# Patient Record
Sex: Male | Born: 2000 | Hispanic: No | Marital: Single | Smoking: Never smoker
Health system: Southern US, Community
[De-identification: ages and names within clinical notes are randomized; demographics above are authoritative.]

## PROBLEM LIST (undated history)

## (undated) DIAGNOSIS — E669 Obesity, unspecified: Secondary | ICD-10-CM

## (undated) HISTORY — PX: LAPAROSCOPIC ORCHIECTOMY: SUR785

---
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
## 2021-01-22 IMAGING — MR MR SHOULDER*R* W/CM
6 series · 40 of 40 positions shown · IV contrast (agent unspecified)
Comparison: None.

CLINICAL DATA: Right shoulder pain and weakness for 2 years

EXAM:
MR ARTHROGRAM OF THE RIGHT SHOULDER
TECHNIQUE: Multiplanar, multisequence MR imaging of the right shoulder was
performed following the administration of intra-articular contrast.
CONTRAST:  See Injection Documentation.

[Series 3: T1 fat-sat · axial · 4.0mm · 0.27mm/px · z∈[-10,+73]mm · 8 of 18 slices shown (1 of 4)]
[im 1/18]
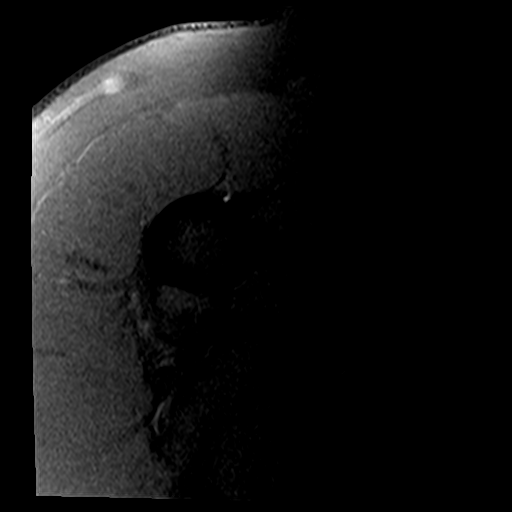
[im 3/18]
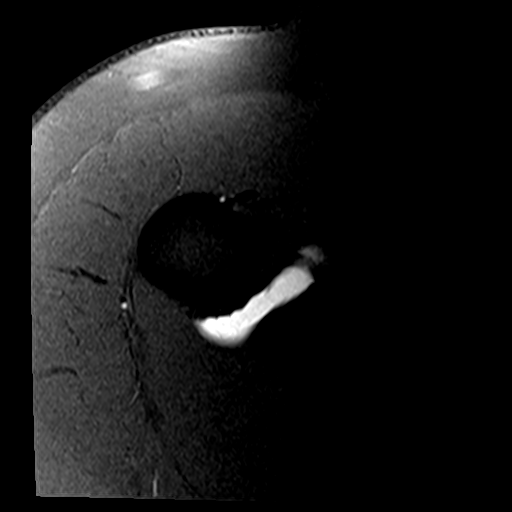
[im 5/18]
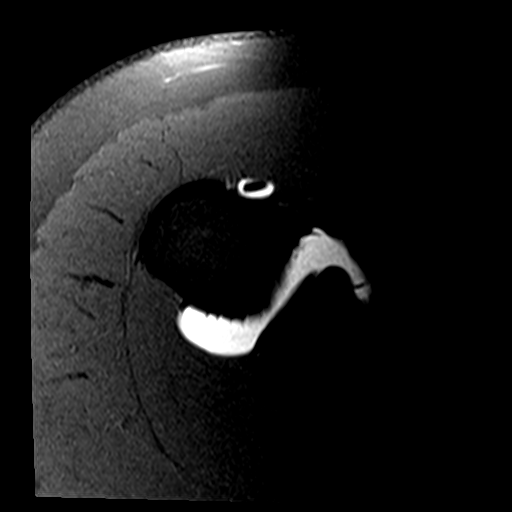
[im 8/18]
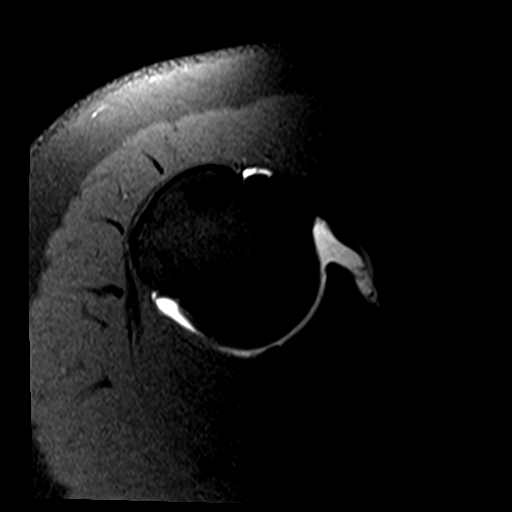
[im 10/18]
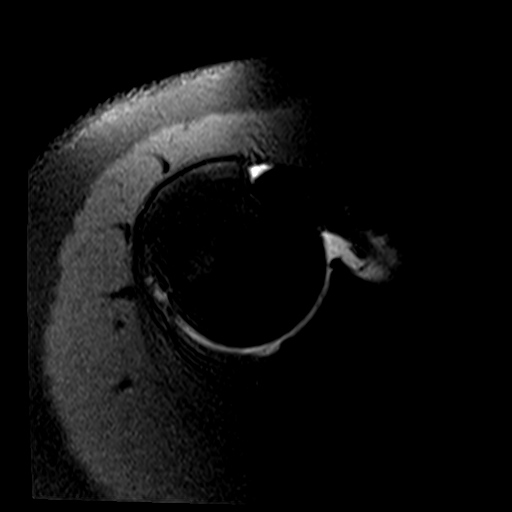
[im 13/18]
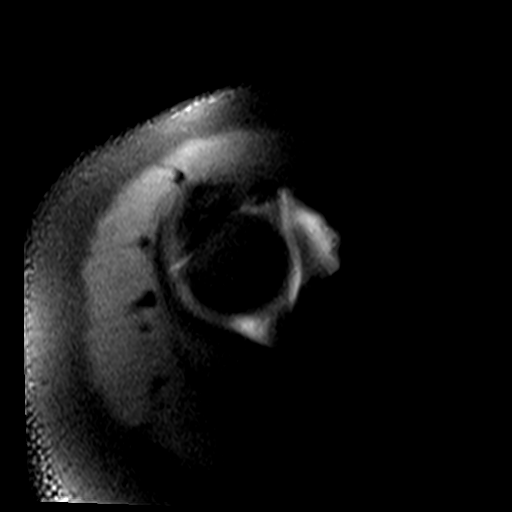
[im 15/18]
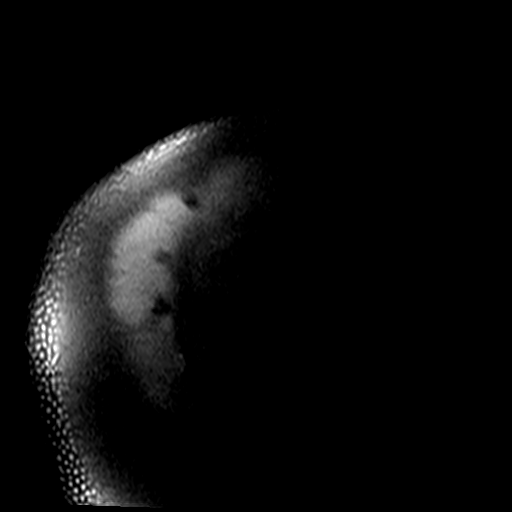
[im 18/18]
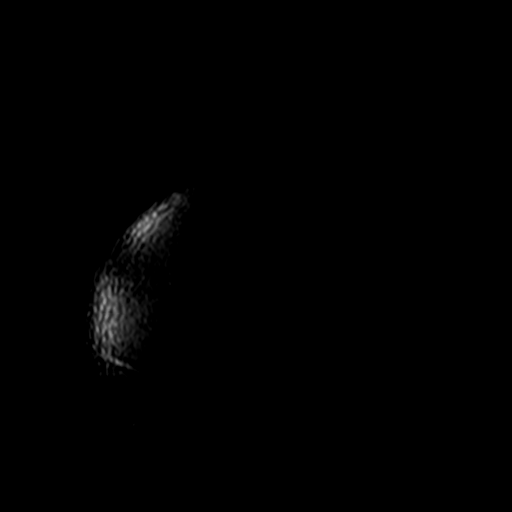

[Series 4: T2 fat-sat · oblique · 4.0mm · 0.55mm/px · 8 of 18 slices shown (1 of 2)]
[im 1/18]
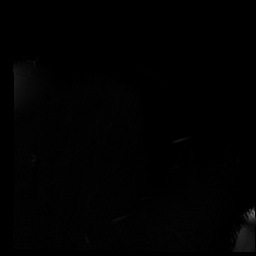
[im 3/18]
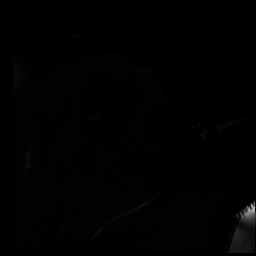
[im 5/18]
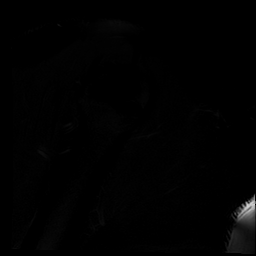
[im 8/18]
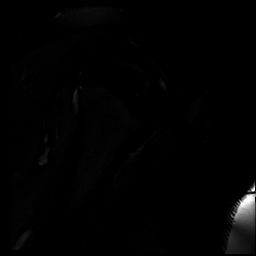
[im 10/18]
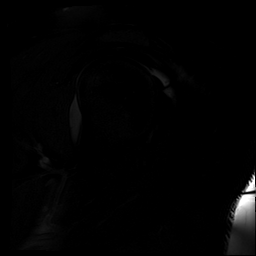
[im 13/18]
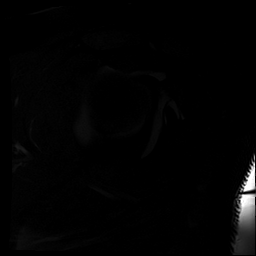
[im 15/18]
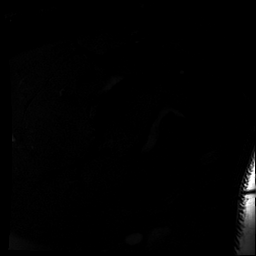
[im 18/18]
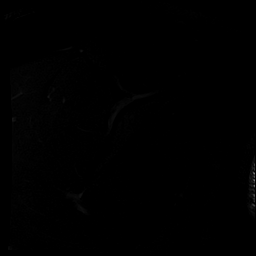

[Series 5: T1 fat-sat · oblique · 4.0mm · 0.55mm/px · 6 of 16 slices shown (2 of 4)]
[im 1/16]
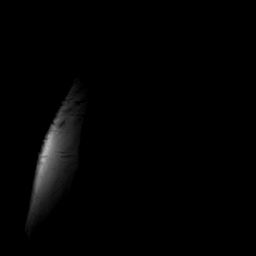
[im 4/16]
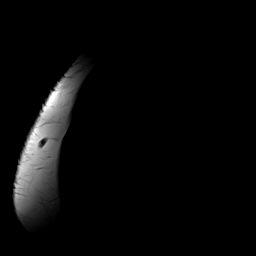
[im 7/16]
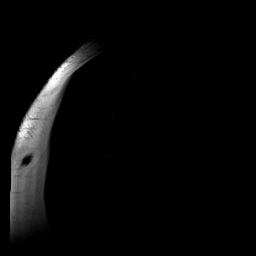
[im 10/16]
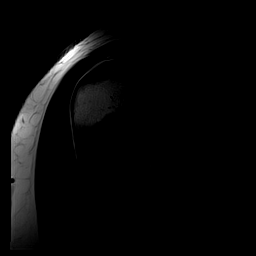
[im 13/16]
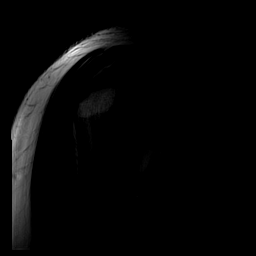
[im 16/16]
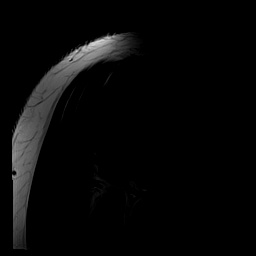

[Series 6: T1 fat-sat · oblique · 4.0mm · 0.55mm/px · 6 of 16 slices shown (3 of 4)]
[im 1/16]
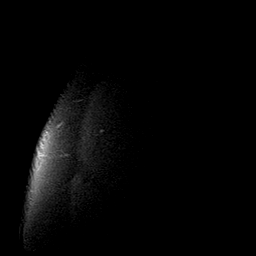
[im 4/16]
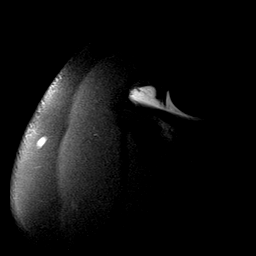
[im 7/16]
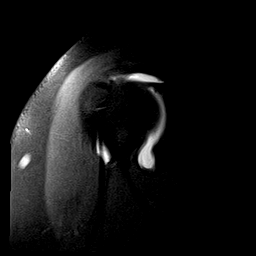
[im 10/16]
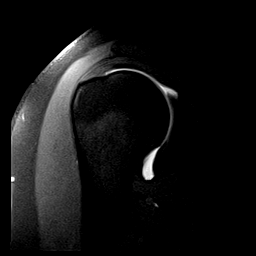
[im 13/16]
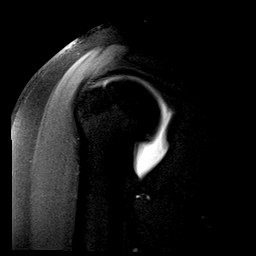
[im 16/16]
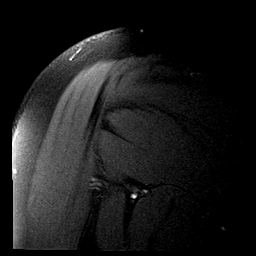

[Series 7: T2 fat-sat · oblique · 4.0mm · 0.55mm/px · 6 of 16 slices shown (2 of 2)]
[im 1/16]
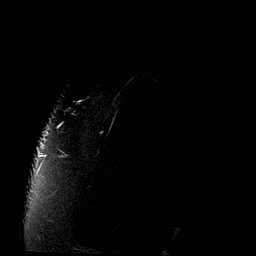
[im 4/16]
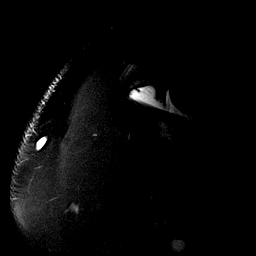
[im 7/16]
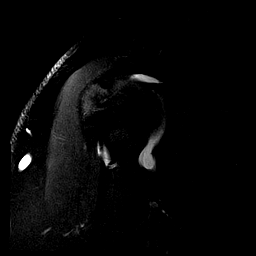
[im 10/16]
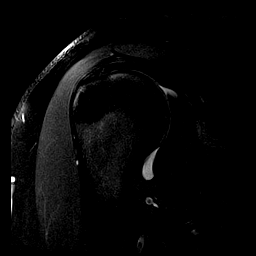
[im 13/16]
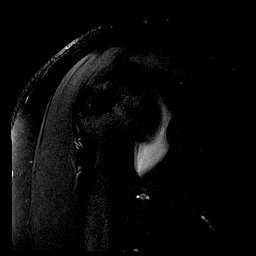
[im 16/16]
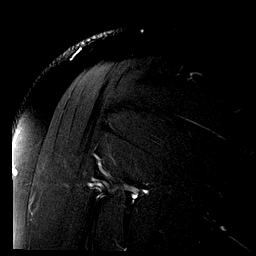

[Series 14: T1 fat-sat · sagittal · 4.0mm · 0.59mm/px · 6 of 16 slices shown (4 of 4)]
[im 1/16]
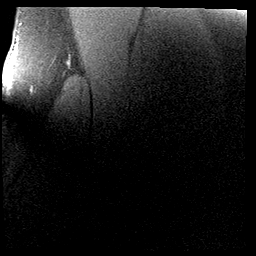
[im 4/16]
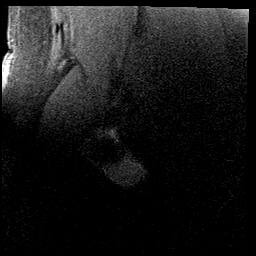
[im 7/16]
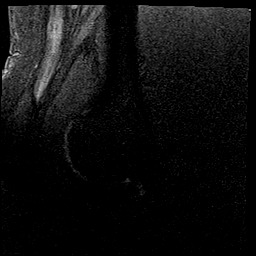
[im 10/16]
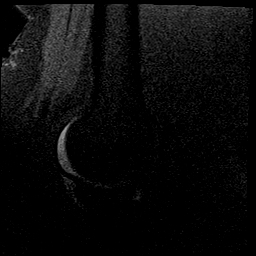
[im 13/16]
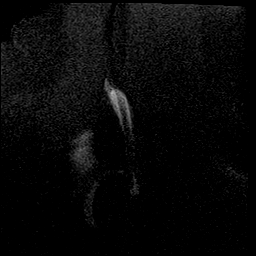
[im 16/16]
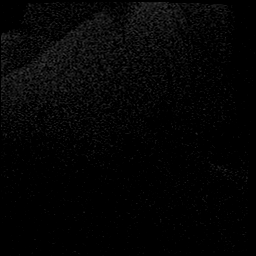

[40 of 40 positions shown; findings below may reference images not displayed]

FINDINGS: Rotator cuff: The supraspinatus, infraspinatus, subscapularis, and
teres minor tendons are intact without tendinosis or tear.

Muscles: Normal rotator cuff muscle bulk without edema, atrophy, or
fatty infiltration.

Biceps long head: Intact and appropriately located without
tendinosis or tear.

Acromioclavicular Joint: Normal AC joint. No fluid or contrast
within the subacromial-subdeltoid bursa.

Glenohumeral Joint: Well distended with injected contrast. Cartilage
is intact without chondral thinning or focal defect.

Labrum: Irregular tear of the posterosuperior labrum at
approximately the 10 o'clock-66 o'clock positions (series 3, images
8-9; series 6, images 5-6). The remainder of the labrum appears
otherwise intact. No paralabral cyst.

Bones: No acute fracture. No dislocation. No marrow edema or
suspicious bone lesion.
IMPRESSION: 1. Posterosuperior labral tear.
2. Intact rotator cuff.

## 2024-03-07 ENCOUNTER — Encounter (HOSPITAL_COMMUNITY): Payer: Self-pay

## 2024-03-07 ENCOUNTER — Other Ambulatory Visit: Payer: Self-pay

## 2024-03-07 ENCOUNTER — Emergency Department (HOSPITAL_COMMUNITY)

## 2024-03-07 ENCOUNTER — Emergency Department (HOSPITAL_COMMUNITY)
Admission: EM | Admit: 2024-03-07 | Discharge: 2024-03-07 | Disposition: A | Discharge status: Home / Self Care | Attending: Emergency Medicine | Admitting: Emergency Medicine

## 2024-03-07 DIAGNOSIS — S61252A Open bite of right middle finger without damage to nail, initial encounter: Secondary | ICD-10-CM | POA: Diagnosis not present

## 2024-03-07 DIAGNOSIS — Z23 Encounter for immunization: Secondary | ICD-10-CM | POA: Insufficient documentation

## 2024-03-07 DIAGNOSIS — W540XXA Bitten by dog, initial encounter: Secondary | ICD-10-CM | POA: Insufficient documentation

## 2024-03-07 DIAGNOSIS — S61256A Open bite of right little finger without damage to nail, initial encounter: Secondary | ICD-10-CM | POA: Insufficient documentation

## 2024-03-07 MED ORDER — AMOXICILLIN-POT CLAVULANATE 875-125 MG PO TABS: 1.0000 | ORAL_TABLET | Freq: Two times a day (BID) | ORAL | 0 refills | Status: DC

## 2024-03-07 MED ORDER — AMOXICILLIN-POT CLAVULANATE 875-125 MG PO TABS
1.0000 | ORAL_TABLET | Freq: Once | ORAL | Status: AC
  Administered 2024-03-07: 1 via ORAL
  Filled 2024-03-07: qty 1

## 2024-03-07 MED ORDER — KETOROLAC TROMETHAMINE 15 MG/ML IJ SOLN
15.0000 mg | Freq: Once | INTRAMUSCULAR | Status: AC
  Administered 2024-03-07: 15 mg via INTRAMUSCULAR
  Filled 2024-03-07: qty 1

## 2024-03-07 MED ORDER — TETANUS-DIPHTH-ACELL PERTUSSIS 5-2.5-18.5 LF-MCG/0.5 IM SUSY
0.5000 mL | PREFILLED_SYRINGE | Freq: Once | INTRAMUSCULAR | Status: AC
  Administered 2024-03-07: 0.5 mL via INTRAMUSCULAR
  Filled 2024-03-07: qty 0.5

## 2024-03-07 NOTE — ED Provider Notes (Signed)
 [place] EMERGENCY DEPARTMENT AT [facility] Provider Note   CSN: [id number] Arrival date & time: 03/07/24  1536     History  Chief Complaint  Patient presents with   Animal Bite    Cameron Mckinney is a 23 y.o. male.  23 yo M with a chief complaint of a dog bite.  Patient had 2 dogs that were fighting over food and he tried to break it up and they bit him on the hand.  He has a bite to his right middle finger as well as to the right fifth digit.  Complaining mostly of pain and swelling to the right middle finger.  Denies other injury.  Unsure of last tetanus.   Animal Bite      Home Medications Prior to Admission medications   Medication Sig Start Date End Date Taking? Authorizing Provider  amoxicillin-clavulanate (AUGMENTIN) 875-125 MG tablet Take 1 tablet by mouth every 12 (twelve) hours. 03/07/24  Yes [name], DO  cetirizine (ZYRTEC) 10 MG tablet Take 10 mg by mouth daily.    [provider]  melatonin 3 MG TABS tablet Take 3 mg by mouth at bedtime.    [provider]  Multiple Vitamins-Minerals (MULTIVITAMIN ADULTS PO) Take by mouth.    [provider]      Allergies    Patient has no known allergies.    Review of Systems   Review of Systems  Physical Exam Updated Vital Signs BP 139/73 (BP Location: Left Arm)   Pulse 73   Temp 97.6 F (36.4 C) (Oral)   Resp 18   Ht 6\' 1"  (1.854 m)   Wt 136.1 kg   SpO2 98%   BMI 39.58 kg/m  Physical Exam Vitals and nursing note reviewed.  Constitutional:      Appearance: He is well-developed.  HENT:     Head: Normocephalic and atraumatic.  Eyes:     Pupils: Pupils are equal, round, and reactive to light.  Neck:     Vascular: No JVD.  Cardiovascular:     Rate and Rhythm: Normal rate and regular rhythm.     Heart sounds: No murmur heard.    No friction rub. No gallop.  Pulmonary:     Effort: No respiratory distress.     Breath sounds: No wheezing.  Abdominal:     General:  There is no distension.     Tenderness: There is no abdominal tenderness. There is no guarding or rebound.  Musculoskeletal:        General: Swelling and tenderness present. Normal range of motion.     Cervical back: Normal range of motion and neck supple.     Comments: Pain and swelling to the right middle finger at the proximal phalanx.  He has 2 small puncture wounds to the dorsal aspect of that finger.  He also has 2 small puncture wounds to the lateral aspect of the fifth digit at the proximal phalanx.  Range of motion is intact.  Skin:    Coloration: Skin is not pale.     Findings: No rash.  Neurological:     Mental Status: He is alert and oriented to person, place, and time.  Psychiatric:        Behavior: Behavior normal.     ED Results / Procedures / Treatments   Labs (all labs ordered are listed, but only abnormal results are displayed) Labs Reviewed - No data to display  EKG None  Radiology DG Hand Complete  Right Result Date: 03/07/2024 CLINICAL DATA:  hand pain dog bite EXAM: RIGHT HAND - COMPLETE 3+ VIEW COMPARISON:  None Available. FINDINGS: No acute fracture or dislocation. Joint spaces and alignment are maintained. No area of erosion or osseous destruction. No unexpected radiopaque foreign body. Soft tissue edema. IMPRESSION: 1. No acute fracture or dislocation. 2. No unexpected radiopaque foreign body. Electronically Signed   By: Meda Klinefelter M.D.   On: 03/07/2024 16:17    Procedures Procedures    Medications Ordered in ED Medications  amoxicillin-clavulanate (AUGMENTIN) 875-125 MG per tablet 1 tablet (has no administration in time range)  ketorolac (TORADOL) 15 MG/ML injection 15 mg (15 mg Intramuscular Given 03/07/24 1611)  Tdap (BOOSTRIX) injection 0.5 mL (0.5 mLs Intramuscular Given 03/07/24 1611)    ED Course/ Medical Decision Making/ A&P                                 Medical Decision Making Amount and/or Complexity of Data Reviewed Radiology:  ordered.  Risk Prescription drug management.   23 yo M with a cc of dog bite to the right hand.  Occurred just prior to arrival.  Will obtain a plain film.  Patient's tetanus is technically within the 10-year window but was done 9 years ago.  Will update today.  Plain film of the right hand independently interpreted by me without fracture or foreign body.  Will discharge the patient home.  PCP follow-up.  4:28 PM:  I have discussed the diagnosis/risks/treatment options with the patient and family.  Evaluation and diagnostic testing in the emergency department does not suggest an emergent condition requiring admission or immediate intervention beyond what has been performed at this time.  They will follow up with PCP. We also discussed returning to the ED immediately if new or worsening sx occur. We discussed the sx which are most concerning (e.g., sudden worsening pain, fever, inability to tolerate by mouth, redness, drainage, fever) that necessitate immediate return. Medications administered to the patient during their visit and any new prescriptions provided to the patient are listed below.  Medications given during this visit Medications  amoxicillin-clavulanate (AUGMENTIN) 875-125 MG per tablet 1 tablet (has no administration in time range)  ketorolac (TORADOL) 15 MG/ML injection 15 mg (15 mg Intramuscular Given 03/07/24 1611)  Tdap (BOOSTRIX) injection 0.5 mL (0.5 mLs Intramuscular Given 03/07/24 1611)     The patient appears reasonably screen and/or stabilized for discharge and I doubt any other medical condition or other [facility] requiring further screening, evaluation, or treatment in the ED at this time prior to discharge.          Final Clinical Impression(s) / ED Diagnoses Final diagnoses:  Dog bite, initial encounter    Rx / DC Orders ED Discharge Orders          Ordered    amoxicillin-clavulanate (AUGMENTIN) 875-125 MG tablet  Every 12 hours        03/07/24 1622               [name], DO 03/07/24 1628

## 2024-03-07 NOTE — ED Triage Notes (Addendum)
 Pt bit unintentionally by his dog when he was breaking him up from other dog over food. Bite to third and first finger on right hand. Third finger is significantly swollen

## 2024-03-07 NOTE — Discharge Instructions (Signed)
  Your x-ray did not show any broken bones. Return for redness, drainage, fever.   Soap and water to clean.  Apply an ointment to the affected area couple times a day.  This could be simple as Vaseline or could be antibiotic ointment if you so choose.

## 2024-05-05 ENCOUNTER — Ambulatory Visit (HOSPITAL_COMMUNITY)
Admission: EM | Admit: 2024-05-05 | Discharge: 2024-05-05 | Disposition: A | Discharge status: Home / Self Care | Attending: Family Medicine | Admitting: Family Medicine

## 2024-05-05 ENCOUNTER — Encounter (HOSPITAL_COMMUNITY): Payer: Self-pay | Admitting: Emergency Medicine

## 2024-05-05 ENCOUNTER — Other Ambulatory Visit: Payer: Self-pay

## 2024-05-05 DIAGNOSIS — J069 Acute upper respiratory infection, unspecified: Secondary | ICD-10-CM | POA: Diagnosis not present

## 2024-05-05 LAB — POC COVID19/FLU A&B COMBO
Covid Antigen, POC: NEGATIVE
Influenza A Antigen, POC: NEGATIVE
Influenza B Antigen, POC: NEGATIVE

## 2024-05-05 MED ORDER — BENZONATATE 100 MG PO CAPS: 100.0000 mg | ORAL_CAPSULE | Freq: Three times a day (TID) | ORAL | 0 refills | Status: AC | PRN

## 2024-05-05 MED ORDER — IBUPROFEN 600 MG PO TABS: 600.0000 mg | ORAL_TABLET | Freq: Three times a day (TID) | ORAL | 0 refills | Status: AC | PRN

## 2024-05-05 NOTE — ED Provider Notes (Signed)
 [facility]    CSN: [id number] Arrival date & time: 05/05/24  1800      History   Chief Complaint Chief Complaint  Patient presents with   Cough    HPI Cameron Mckinney is a 23 y.o. male.    Cough Here for nasal congestion and rhinorrhea, cough, and malaise.  His throat is also bothering him.  Symptoms began on May 5.  No nausea.  Did have 1 episode of emesis this morning that sounds mostly posttussive.  He has not had any diarrhea.  Medical history is negative for asthma or diabetes or hypertension.  NKDA  Past Medical History:  Diagnosis Date   Obesity     There are no active problems to display for this patient.   Past Surgical History:  Procedure Laterality Date   LAPAROSCOPIC ORCHIECTOMY     SHOULDER ARTHROSCOPY WITH CAPSULORRHAPHY Right 07/06/2020   Procedure: SHOULDER ATHROSCOPY WITH CAPSULORRHAPHY;  Surgeon: Micheline Ahr, MD;  Location: [facility];  Service: Orthopedics;  Laterality: Right;       Home Medications    Prior to Admission medications   Medication Sig Start Date End Date Taking? Authorizing Provider  benzonatate (TESSALON) 100 MG capsule Take 1 capsule (100 mg total) by mouth 3 (three) times daily as needed for cough. 05/05/24  Yes Ann Keto, MD  ibuprofen  (ADVIL ) 600 MG tablet Take 1 tablet (600 mg total) by mouth every 8 (eight) hours as needed (pain). 05/05/24  Yes Kayman Snuffer K, MD  cetirizine (ZYRTEC) 10 MG tablet Take 10 mg by mouth daily.    [provider]  melatonin 3 MG TABS tablet Take 3 mg by mouth at bedtime.    [provider]  Multiple Vitamins-Minerals (MULTIVITAMIN ADULTS PO) Take by mouth.    [provider]    Family History History reviewed. No pertinent family history.  Social History Social History   Tobacco Use   Smoking status: Never   Smokeless tobacco: Never  Vaping Use   Vaping status: Never Used  Substance Use Topics   Alcohol use: No    Drug use: No     Allergies   Patient has no known allergies.   Review of Systems Review of Systems  Respiratory:  Positive for cough.      Physical Exam Triage Vital Signs ED Triage Vitals  Encounter Vitals Group     BP 05/05/24 1855 117/78     Systolic BP Percentile --      Diastolic BP Percentile --      Pulse Rate 05/05/24 1855 69     Resp 05/05/24 1855 20     Temp 05/05/24 1855 99.3 F (37.4 C)     Temp Source 05/05/24 1855 Oral     SpO2 05/05/24 1855 96 %     Weight --      Height --      Head Circumference --      Peak Flow --      Pain Score 05/05/24 1853 0     Pain Loc --      Pain Education --      Exclude from Growth Chart --    No data found.  Updated Vital Signs BP 117/78 (BP Location: Right Arm)   Pulse 69   Temp 99.3 F (37.4 C) (Oral)   Resp 20   SpO2 96%   Visual Acuity Right Eye Distance:   Left Eye Distance:   Bilateral Distance:  Right Eye Near:   Left Eye Near:    Bilateral Near:     Physical Exam Vitals reviewed.  Constitutional:      General: He is not in acute distress.    Appearance: He is not toxic-appearing.  HENT:     Right Ear: Tympanic membrane and ear canal normal.     Left Ear: Tympanic membrane and ear canal normal.     Nose: Congestion present.     Mouth/Throat:     Mouth: Mucous membranes are moist.     Comments: There is some erythema of the posterior oropharynx and the tonsillar pillars.  Clear mucus is draining in the posterior oropharynx Eyes:     Extraocular Movements: Extraocular movements intact.     Conjunctiva/sclera: Conjunctivae normal.     Pupils: Pupils are equal, round, and reactive to light.  Cardiovascular:     Rate and Rhythm: Normal rate and regular rhythm.     Heart sounds: No murmur heard. Pulmonary:     Effort: Pulmonary effort is normal. No respiratory distress.     Breath sounds: Normal breath sounds. No stridor. No wheezing, rhonchi or rales.  Musculoskeletal:     Cervical back:  Neck supple.  Lymphadenopathy:     Cervical: No cervical adenopathy.  Skin:    Capillary Refill: Capillary refill takes less than 2 seconds.     Coloration: Skin is not jaundiced or pale.  Neurological:     General: No focal deficit present.     Mental Status: He is alert and oriented to person, place, and time.  Psychiatric:        Behavior: Behavior normal.      UC Treatments / Results  Labs (all labs ordered are listed, but only abnormal results are displayed) Labs Reviewed  POC COVID19/FLU A&B COMBO    EKG   Radiology No results found.  Procedures Procedures (including critical care time)  Medications Ordered in UC Medications - No data to display  Initial Impression / Assessment and Plan / UC Course  I have reviewed the triage vital signs and the nursing notes.  Pertinent labs & imaging results that were available during my care of the patient were reviewed by me and considered in my medical decision making (see chart for details).     COVID and flu testing negative.  Tessalon Perles were sent in for the cough. Final Clinical Impressions(s) / UC Diagnoses   Final diagnoses:  Viral URI with cough     Discharge Instructions      The COVID and flu test was negative.  Take benzonatate 100 mg, 1 tab every 8 hours as needed for cough.  Take ibuprofen  600 mg--1 tab every 8 hours as needed for pain.  You can continue taking DayQuil or NyQuil as needed for the congestion also.      ED Prescriptions     Medication Sig Dispense Auth. Provider   benzonatate (TESSALON) 100 MG capsule Take 1 capsule (100 mg total) by mouth 3 (three) times daily as needed for cough. 21 capsule Ann Keto, MD   ibuprofen  (ADVIL ) 600 MG tablet Take 1 tablet (600 mg total) by mouth every 8 (eight) hours as needed (pain). 15 tablet Dvaughn Fickle K, MD      PDMP not reviewed this encounter.   Ann Keto, MD 05/05/24 [phone]

## 2024-05-05 NOTE — ED Triage Notes (Addendum)
 Complains of a runny nose and cough for 3 days.  Reports feeling weakness and restlessness particularly at night.  Patient has not been able to check temperature.  Reports one episode of vomiting this morning.  Patient has taken delsym and dayquil  "Cant breathe through my nose"

## 2024-05-05 NOTE — Discharge Instructions (Addendum)
 The COVID and flu test was negative.  Take benzonatate 100 mg, 1 tab every 8 hours as needed for cough.  Take ibuprofen  600 mg--1 tab every 8 hours as needed for pain.  You can continue taking DayQuil or NyQuil as needed for the congestion also.
